# Patient Record
Sex: Female | Born: 1985 | Race: White | Hispanic: No | Marital: Married
Health system: Southern US, Community
[De-identification: ages and names within clinical notes are randomized; demographics above are authoritative.]

---

## 2007-02-05 ENCOUNTER — Emergency Department: Payer: Self-pay | Admitting: Emergency Medicine

## 2007-02-05 ENCOUNTER — Other Ambulatory Visit: Payer: Self-pay

## 2007-08-21 IMAGING — CR DG CHEST 2V
1 series · 2 of 2 positions shown · non-contrast
Comparison: none

REASON FOR EXAM: difficulty Drey
COMMENTS:

PROCEDURE:     DXR - DXR CHEST PA (OR AP) AND LATERAL  - February 05, 2007  [DATE]
RESULT:     The lungs are adequately inflated. There is no focal infiltrate.
There is mild hemidiaphragm flattening. The perihilar lung markings are
increased especially inferiorly. I see no acute bony abnormality.

[Series 1: view not recorded · 0.17mm/px · 2 of 2 slices shown]
[im 1/2]
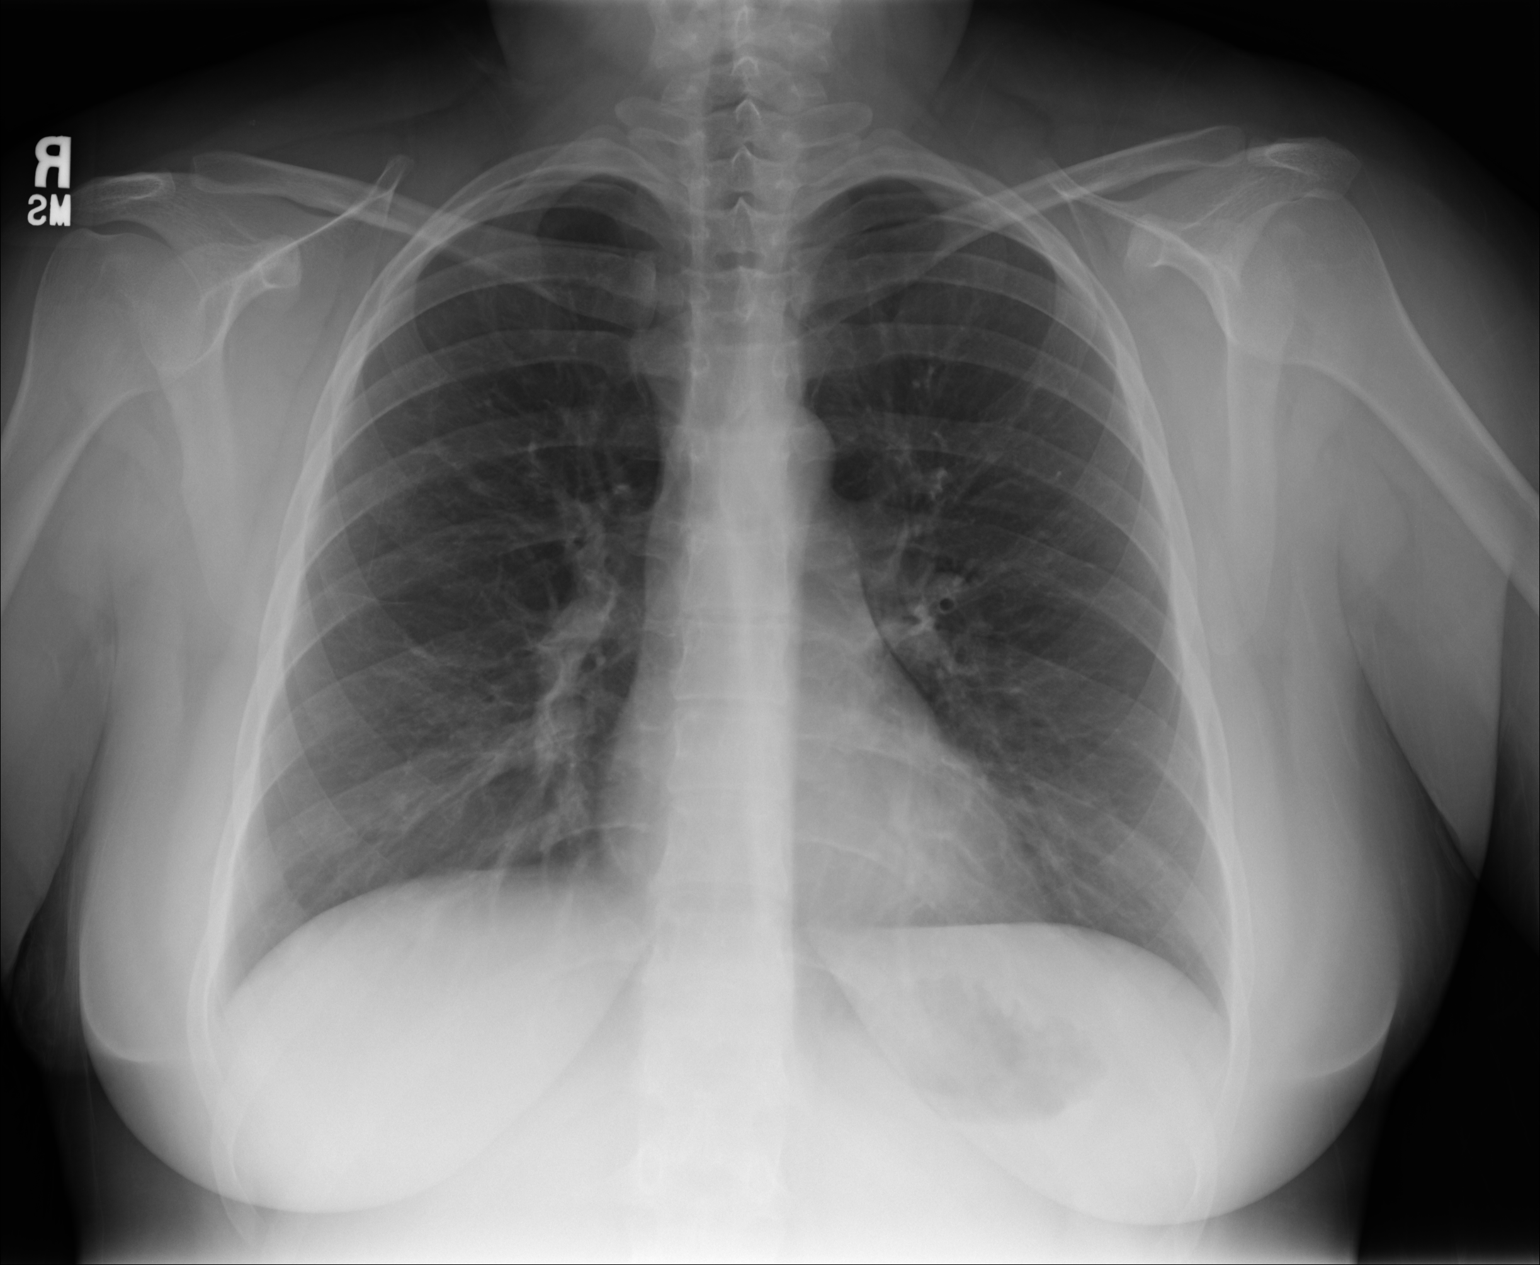
[im 2/2]
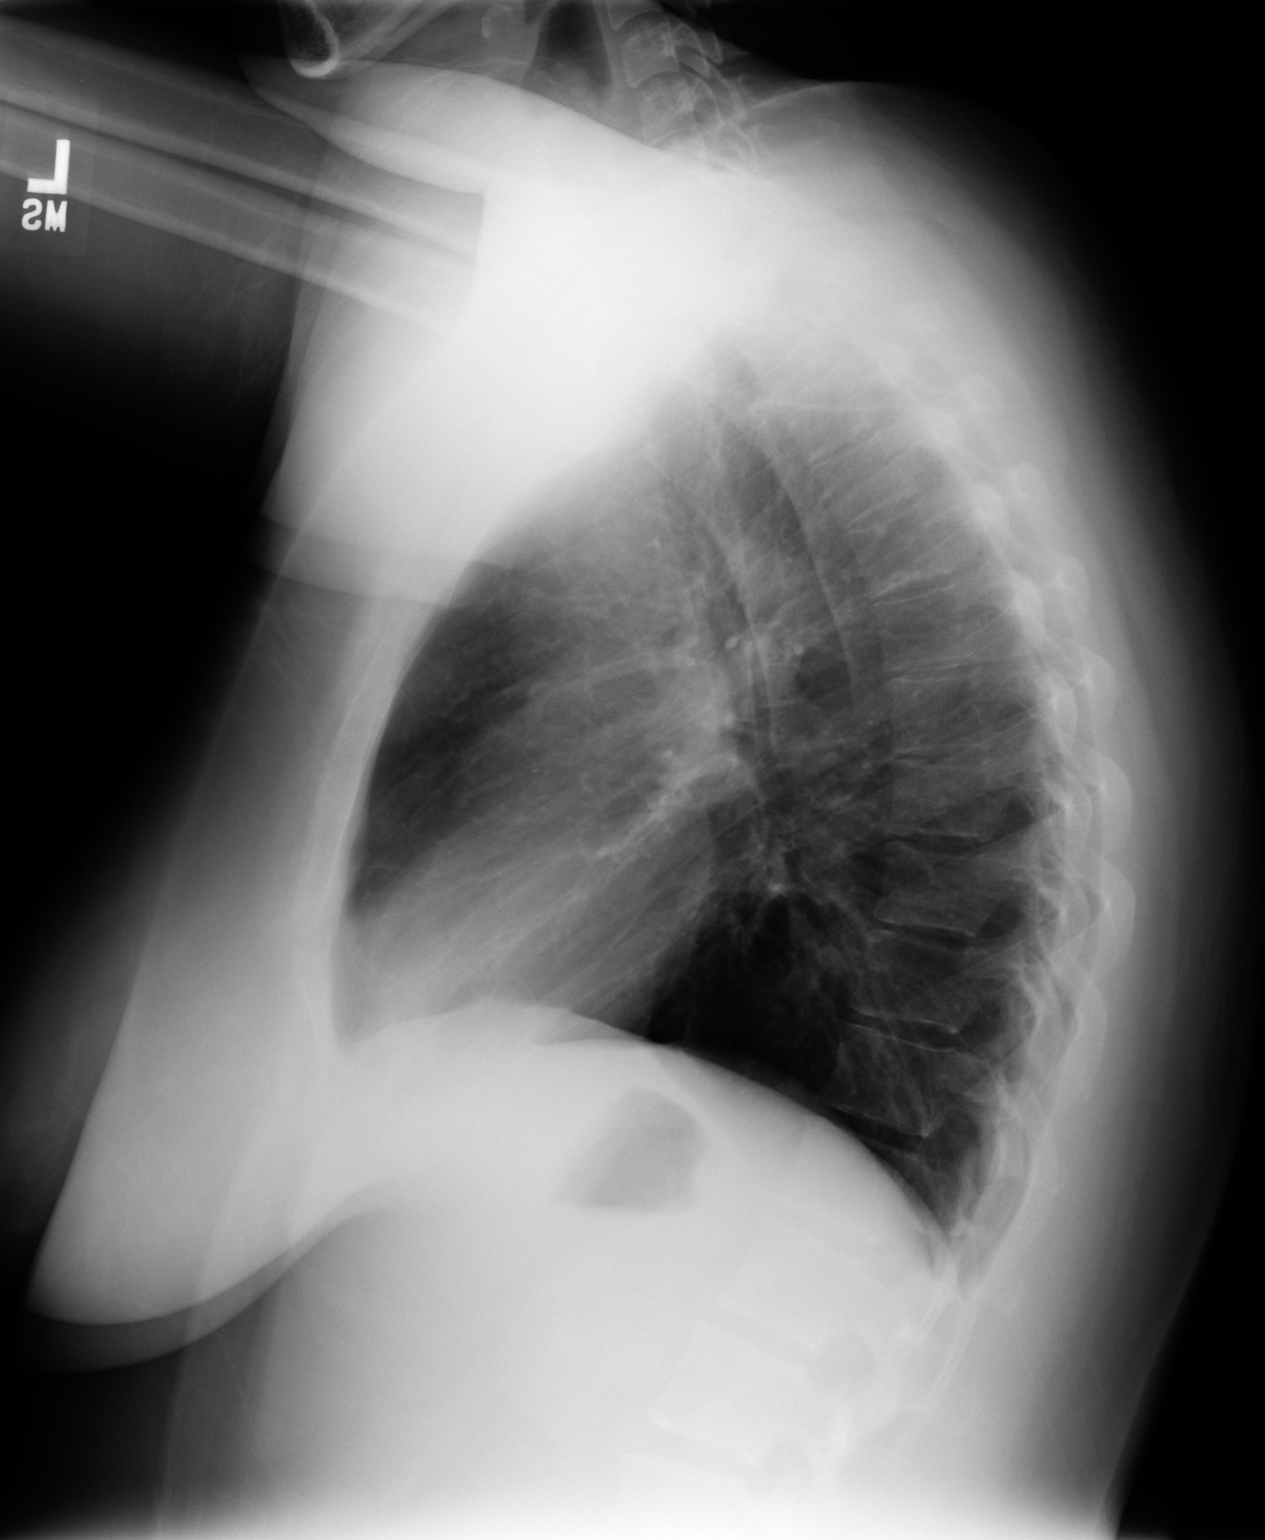

[2 of 2 positions shown; findings below may reference images not displayed]

IMPRESSION: 1.     There are findings which likely reflect an element of reactive airway
disease. I do not see evidence of pneumonia. I cannot exclude acute
bronchitis. Follow-up films are recommended following any therapy or if the
patient's symptoms persist.

## 2009-02-07 ENCOUNTER — Emergency Department: Payer: Self-pay | Admitting: Emergency Medicine

## 2009-10-13 ENCOUNTER — Emergency Department: Payer: Self-pay | Admitting: Emergency Medicine

## 2010-04-16 ENCOUNTER — Emergency Department: Payer: Self-pay | Admitting: Emergency Medicine

## 2010-07-07 ENCOUNTER — Emergency Department: Payer: Self-pay | Admitting: Emergency Medicine

## 2013-01-08 ENCOUNTER — Emergency Department: Payer: Self-pay | Admitting: Internal Medicine

## 2013-04-04 ENCOUNTER — Observation Stay: Payer: Self-pay | Admitting: Obstetrics and Gynecology

## 2013-04-15 ENCOUNTER — Ambulatory Visit: Payer: Self-pay | Admitting: Obstetrics and Gynecology

## 2013-04-15 LAB — CBC WITH DIFFERENTIAL/PLATELET
Basophil #: 0 10*3/uL (ref 0.0–0.1)
Basophil %: 0.2 %
HCT: 32.1 % — ABNORMAL LOW (ref 35.0–47.0)
Lymphocyte #: 1.7 10*3/uL (ref 1.0–3.6)
Lymphocyte %: 14.4 %
MCH: 27 pg (ref 26.0–34.0)
MCV: 81 fL (ref 80–100)
Monocyte #: 0.6 x10 3/mm (ref 0.2–0.9)
Neutrophil #: 9.4 10*3/uL — ABNORMAL HIGH (ref 1.4–6.5)
Neutrophil %: 79.3 %
Platelet: 313 10*3/uL (ref 150–440)

## 2013-04-18 ENCOUNTER — Inpatient Hospital Stay: Payer: Self-pay

## 2013-04-19 LAB — HEMATOCRIT: HCT: 27.2 % — ABNORMAL LOW (ref 35.0–47.0)

## 2014-08-01 ENCOUNTER — Emergency Department: Payer: Self-pay | Admitting: Emergency Medicine
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
